# Patient Record
Sex: Male | Born: 1950 | Race: White | Hispanic: No | Marital: Married | Smoking: Former smoker
Health system: Southern US, Community
[De-identification: ages and names within clinical notes are randomized; demographics above are authoritative.]

## PROBLEM LIST (undated history)

## (undated) DIAGNOSIS — M1990 Unspecified osteoarthritis, unspecified site: Secondary | ICD-10-CM

---

## 2013-12-27 HISTORY — PX: COLONOSCOPY: SHX174

## 2019-09-13 ENCOUNTER — Other Ambulatory Visit: Payer: Self-pay | Admitting: Neurosurgery

## 2019-09-24 NOTE — Pre-Procedure Instructions (Signed)
Shawn Mendoza  09/24/2019    Your procedure is scheduled on Friday, September 28, 2019 at 1:25 PM.   Report to [facility] Entrance "A" Admitting Office at 11:25 AM.   Call this number if you have problems the morning of surgery: [phone]   Questions prior to day of surgery, please call [phone] between 8 & 4 PM.   Remember:  Do not eat or drink after midnight Thursday, 09/27/19.  Take these medicines the morning of surgery with A SIP OF WATER: Gabapentin (Neurontin)  Stop Multivitamins and Glucosamine as of today prior to surgery. Do not use Aspirin products (BC Powder, Goody's, etc), NSAIDS (Ibuprofen, Aleve, etc), Herbal medications or Fish Oil.    Do not wear jewelry.  Do not wear lotions, powders, cologne or deodorant.  Men may shave face and neck.  Do not bring valuables to the hospital.  [facility] is not responsible for any belongings or valuables.  Contacts, dentures or bridgework may not be worn into surgery.  Leave your suitcase in the car.  After surgery it may be brought to your room.  For patients admitted to the hospital, discharge time will be determined by your treatment team.  [facility] - Preparing for Surgery  Before surgery, you can play an important role.  Because skin is not sterile, your skin needs to be as free of germs as possible.  You can reduce the number of germs on you skin by washing with CHG (chlorahexidine gluconate) soap before surgery.  CHG is an antiseptic cleaner which kills germs and bonds with the skin to continue killing germs even after washing.  Oral Hygiene is also important in reducing the risk of infection.  Remember to brush your teeth with your regular toothpaste the morning of surgery.  Please DO NOT use if you have an allergy to CHG or antibacterial soaps.  If your skin becomes reddened/irritated stop using the CHG and inform your nurse when you arrive at Short Stay.  Do not shave (including legs and  underarms) for at least 48 hours prior to the first CHG shower.  You may shave your face.  Please follow these instructions carefully:   1.  Shower with CHG Soap the night before surgery and the morning of Surgery.  2.  If you choose to wash your hair, wash your hair first as usual with your normal shampoo.  3.  After you shampoo, rinse your hair and body thoroughly to remove the shampoo. 4.  Use CHG as you would any other liquid soap.  You can apply chg directly to the skin and wash gently with a      scrungie or washcloth.           5.  Apply the CHG Soap to your body ONLY FROM THE NECK DOWN.   Do not use on open wounds or open sores. Avoid contact with your eyes, ears, mouth and genitals (private parts).  Wash genitals (private parts) with your normal soap - do this prior to using CHG soap.  6.  Wash thoroughly, paying special attention to the area where your surgery will be performed.  7.  Thoroughly rinse your body with warm water from the neck down.  8.  DO NOT shower/wash with your normal soap after using and rinsing off the CHG Soap.  9.  Pat yourself dry with a clean towel.            10.  Wear clean pajamas.  11.  Place clean sheets on your bed the night of your first shower and do not sleep with pets.  Day of Surgery  Shower as above. Do not apply any lotions/deodorants the morning of surgery.   Please wear clean clothes to the hospital. Remember to brush your teeth with toothpaste.    Please read over the fact sheets that you were given.

## 2019-09-25 ENCOUNTER — Other Ambulatory Visit (HOSPITAL_COMMUNITY)
Admission: RE | Admit: 2019-09-25 | Discharge: 2019-09-25 | Disposition: A | Discharge status: Home / Self Care | Payer: Medicare HMO | Source: Ambulatory Visit | Attending: Neurosurgery | Admitting: Neurosurgery

## 2019-09-25 ENCOUNTER — Encounter (HOSPITAL_COMMUNITY): Payer: Self-pay

## 2019-09-25 ENCOUNTER — Encounter (HOSPITAL_COMMUNITY)
Admission: RE | Admit: 2019-09-25 | Discharge: 2019-09-25 | Disposition: A | Discharge status: Home / Self Care | Payer: Medicare HMO | Source: Ambulatory Visit | Attending: Neurosurgery | Admitting: Neurosurgery

## 2019-09-25 ENCOUNTER — Other Ambulatory Visit: Payer: Self-pay

## 2019-09-25 HISTORY — DX: Unspecified osteoarthritis, unspecified site: M19.90

## 2019-09-25 LAB — CBC WITH DIFFERENTIAL/PLATELET
Abs Immature Granulocytes: 0.02 10*3/uL (ref 0.00–0.07)
Basophils Absolute: 0 10*3/uL (ref 0.0–0.1)
Basophils Relative: 1 %
Eosinophils Absolute: 0 10*3/uL (ref 0.0–0.5)
Eosinophils Relative: 1 %
HCT: 47 % (ref 39.0–52.0)
Hemoglobin: 16.4 g/dL (ref 13.0–17.0)
Immature Granulocytes: 1 %
Lymphocytes Relative: 27 %
Lymphs Abs: 1.2 10*3/uL (ref 0.7–4.0)
MCH: 34.1 pg — ABNORMAL HIGH (ref 26.0–34.0)
MCHC: 34.9 g/dL (ref 30.0–36.0)
MCV: 97.7 fL (ref 80.0–100.0)
Monocytes Absolute: 0.5 10*3/uL (ref 0.1–1.0)
Monocytes Relative: 12 %
Neutro Abs: 2.6 10*3/uL (ref 1.7–7.7)
Neutrophils Relative %: 58 %
Platelets: 238 10*3/uL (ref 150–400)
RBC: 4.81 MIL/uL (ref 4.22–5.81)
RDW: 12.1 % (ref 11.5–15.5)
WBC: 4.4 10*3/uL (ref 4.0–10.5)
nRBC: 0 % (ref 0.0–0.2)

## 2019-09-25 LAB — TYPE AND SCREEN
ABO/RH(D): O NEG
Antibody Screen: NEGATIVE

## 2019-09-25 LAB — SURGICAL PCR SCREEN
MRSA, PCR: NEGATIVE
Staphylococcus aureus: NEGATIVE

## 2019-09-25 LAB — ABO/RH: ABO/RH(D): O NEG

## 2019-09-25 NOTE — Progress Notes (Signed)
PCP - Jens Som  Chest x-ray - n/a EKG - n/a  COVID TEST- 09-25-19   Anesthesia review: n/a  Patient denies shortness of breath, fever, cough and chest pain at PAT appointment   Patient verbalized understanding of instructions that were given to them at the PAT appointment. Patient was also instructed that they will need to review over the PAT instructions again at home before surgery.

## 2019-09-26 LAB — NOVEL CORONAVIRUS, NAA (HOSP ORDER, SEND-OUT TO REF LAB; TAT 18-24 HRS): SARS-CoV-2, NAA: NOT DETECTED

## 2019-09-28 ENCOUNTER — Inpatient Hospital Stay (HOSPITAL_COMMUNITY): Payer: Medicare HMO | Admitting: Certified Registered Nurse Anesthetist

## 2019-09-28 ENCOUNTER — Encounter (HOSPITAL_COMMUNITY)
Admission: RE | Disposition: A | Discharge status: Home / Self Care | Payer: Self-pay | Source: Home / Self Care | Attending: Neurosurgery

## 2019-09-28 ENCOUNTER — Inpatient Hospital Stay (HOSPITAL_COMMUNITY): Payer: Medicare HMO

## 2019-09-28 ENCOUNTER — Encounter (HOSPITAL_COMMUNITY): Payer: Self-pay

## 2019-09-28 ENCOUNTER — Inpatient Hospital Stay (HOSPITAL_COMMUNITY)
Admission: RE | Admit: 2019-09-28 | Discharge: 2019-09-29 | DRG: 455 | Disposition: A | Discharge status: Home / Self Care | Payer: Medicare HMO | Attending: Neurosurgery | Admitting: Neurosurgery

## 2019-09-28 ENCOUNTER — Other Ambulatory Visit: Payer: Self-pay

## 2019-09-28 DIAGNOSIS — Z20828 Contact with and (suspected) exposure to other viral communicable diseases: Secondary | ICD-10-CM | POA: Diagnosis present

## 2019-09-28 DIAGNOSIS — M48061 Spinal stenosis, lumbar region without neurogenic claudication: Secondary | ICD-10-CM | POA: Diagnosis present

## 2019-09-28 DIAGNOSIS — M4316 Spondylolisthesis, lumbar region: Principal | ICD-10-CM | POA: Diagnosis present

## 2019-09-28 DIAGNOSIS — M4310 Spondylolisthesis, site unspecified: Secondary | ICD-10-CM | POA: Diagnosis present

## 2019-09-28 DIAGNOSIS — Z87891 Personal history of nicotine dependence: Secondary | ICD-10-CM

## 2019-09-28 DIAGNOSIS — Z79899 Other long term (current) drug therapy: Secondary | ICD-10-CM

## 2019-09-28 DIAGNOSIS — M5416 Radiculopathy, lumbar region: Secondary | ICD-10-CM | POA: Diagnosis not present

## 2019-09-28 DIAGNOSIS — Z419 Encounter for procedure for purposes other than remedying health state, unspecified: Secondary | ICD-10-CM

## 2019-09-28 SURGERY — POSTERIOR LUMBAR FUSION 1 LEVEL
Anesthesia: General | Site: Back

## 2019-09-28 MED ORDER — ADULT MULTIVITAMIN W/MINERALS CH
1.0000 | ORAL_TABLET | Freq: Every day | ORAL | Status: DC
  Administered 2019-09-29: 08:00:00 1 via ORAL
  Filled 2019-09-28: qty 1

## 2019-09-28 MED ORDER — LIDOCAINE 2% (20 MG/ML) 5 ML SYRINGE
INTRAMUSCULAR | Status: DC | PRN
  Administered 2019-09-28: 40 mg via INTRAVENOUS

## 2019-09-28 MED ORDER — ONDANSETRON HCL 4 MG/2ML IJ SOLN
INTRAMUSCULAR | Status: DC | PRN
  Administered 2019-09-28: 4 mg via INTRAVENOUS

## 2019-09-28 MED ORDER — GABAPENTIN 300 MG PO CAPS
300.0000 mg | ORAL_CAPSULE | Freq: Three times a day (TID) | ORAL | Status: DC | PRN
  Administered 2019-09-29: 07:00:00 300 mg via ORAL
  Filled 2019-09-28: qty 1

## 2019-09-28 MED ORDER — VANCOMYCIN HCL 1000 MG IV SOLR
INTRAVENOUS | Status: AC
  Filled 2019-09-28: qty 1000

## 2019-09-28 MED ORDER — ROCURONIUM BROMIDE 10 MG/ML (PF) SYRINGE
PREFILLED_SYRINGE | INTRAVENOUS | Status: AC
  Filled 2019-09-28: qty 10

## 2019-09-28 MED ORDER — SODIUM CHLORIDE 0.9 % IV SOLN
INTRAVENOUS | Status: DC | PRN
  Administered 2019-09-28: 14:00:00 500 mL

## 2019-09-28 MED ORDER — THROMBIN 20000 UNITS EX SOLR
CUTANEOUS | Status: DC | PRN
  Administered 2019-09-28: 14:00:00 20 mL via TOPICAL

## 2019-09-28 MED ORDER — PHENOL 1.4 % MT LIQD: 1.0000 | OROMUCOSAL | Status: DC | PRN

## 2019-09-28 MED ORDER — ROCURONIUM BROMIDE 10 MG/ML (PF) SYRINGE
PREFILLED_SYRINGE | INTRAVENOUS | Status: DC | PRN
  Administered 2019-09-28 (×3): 10 mg via INTRAVENOUS
  Administered 2019-09-28: 20 mg via INTRAVENOUS
  Administered 2019-09-28: 10 mg via INTRAVENOUS
  Administered 2019-09-28: 50 mg via INTRAVENOUS
  Administered 2019-09-28: 20 mg via INTRAVENOUS

## 2019-09-28 MED ORDER — POLYETHYLENE GLYCOL 3350 17 G PO PACK: 17.0000 g | PACK | Freq: Every day | ORAL | Status: DC | PRN

## 2019-09-28 MED ORDER — HYDROMORPHONE HCL 1 MG/ML IJ SOLN
1.0000 mg | INTRAMUSCULAR | Status: DC | PRN
  Administered 2019-09-28: 17:00:00 1 mg via INTRAVENOUS
  Filled 2019-09-28: qty 1

## 2019-09-28 MED ORDER — CHLORHEXIDINE GLUCONATE CLOTH 2 % EX PADS: 6.0000 | MEDICATED_PAD | Freq: Once | CUTANEOUS | Status: DC

## 2019-09-28 MED ORDER — FLEET ENEMA 7-19 GM/118ML RE ENEM: 1.0000 | ENEMA | Freq: Once | RECTAL | Status: DC | PRN

## 2019-09-28 MED ORDER — THROMBIN 20000 UNITS EX SOLR
CUTANEOUS | Status: AC
  Filled 2019-09-28: qty 20000

## 2019-09-28 MED ORDER — SUFENTANIL CITRATE 50 MCG/ML IV SOLN
INTRAVENOUS | Status: DC | PRN
  Administered 2019-09-28 (×2): 15 ug via INTRAVENOUS
  Administered 2019-09-28: 5 ug via INTRAVENOUS

## 2019-09-28 MED ORDER — MIDAZOLAM HCL 2 MG/2ML IJ SOLN
INTRAMUSCULAR | Status: AC
  Filled 2019-09-28: qty 2

## 2019-09-28 MED ORDER — LATANOPROST 0.005 % OP SOLN
1.0000 [drp] | Freq: Every day | OPHTHALMIC | Status: DC
  Administered 2019-09-28: 20:00:00 1 [drp] via OPHTHALMIC
  Filled 2019-09-28: qty 2.5

## 2019-09-28 MED ORDER — VANCOMYCIN HCL 1 G IV SOLR
INTRAVENOUS | Status: DC | PRN
  Administered 2019-09-28: 1000 mg via TOPICAL

## 2019-09-28 MED ORDER — SODIUM CHLORIDE 0.9 % IV SOLN: 250.0000 mL | INTRAVENOUS | Status: DC

## 2019-09-28 MED ORDER — LIDOCAINE 2% (20 MG/ML) 5 ML SYRINGE
INTRAMUSCULAR | Status: AC
  Filled 2019-09-28: qty 5

## 2019-09-28 MED ORDER — ONDANSETRON HCL 4 MG/2ML IJ SOLN: 4.0000 mg | Freq: Once | INTRAMUSCULAR | Status: DC | PRN

## 2019-09-28 MED ORDER — OXYCODONE HCL 5 MG PO TABS: 5.0000 mg | ORAL_TABLET | Freq: Once | ORAL | Status: DC | PRN

## 2019-09-28 MED ORDER — LACTATED RINGERS IV SOLN
INTRAVENOUS | Status: DC
  Administered 2019-09-28 (×2): via INTRAVENOUS

## 2019-09-28 MED ORDER — DIAZEPAM 5 MG PO TABS
5.0000 mg | ORAL_TABLET | Freq: Four times a day (QID) | ORAL | Status: DC | PRN
  Administered 2019-09-28: 22:00:00 5 mg via ORAL
  Administered 2019-09-29 (×2): 10 mg via ORAL
  Filled 2019-09-28 (×2): qty 2
  Filled 2019-09-28: qty 1

## 2019-09-28 MED ORDER — HYDROCODONE-ACETAMINOPHEN 10-325 MG PO TABS: 1.0000 | ORAL_TABLET | ORAL | Status: DC | PRN

## 2019-09-28 MED ORDER — PROPOFOL 10 MG/ML IV BOLUS
INTRAVENOUS | Status: AC
  Filled 2019-09-28: qty 20

## 2019-09-28 MED ORDER — CEFAZOLIN SODIUM-DEXTROSE 1-4 GM/50ML-% IV SOLN
1.0000 g | Freq: Three times a day (TID) | INTRAVENOUS | Status: AC
  Administered 2019-09-28 – 2019-09-29 (×2): 1 g via INTRAVENOUS
  Filled 2019-09-28 (×2): qty 50

## 2019-09-28 MED ORDER — 0.9 % SODIUM CHLORIDE (POUR BTL) OPTIME
TOPICAL | Status: DC | PRN
  Administered 2019-09-28: 14:00:00 1000 mL

## 2019-09-28 MED ORDER — MENTHOL 3 MG MT LOZG: 1.0000 | LOZENGE | OROMUCOSAL | Status: DC | PRN

## 2019-09-28 MED ORDER — CEFAZOLIN SODIUM-DEXTROSE 2-4 GM/100ML-% IV SOLN
2.0000 g | INTRAVENOUS | Status: AC
  Administered 2019-09-28: 2 g via INTRAVENOUS

## 2019-09-28 MED ORDER — MIDAZOLAM HCL 5 MG/5ML IJ SOLN
INTRAMUSCULAR | Status: DC | PRN
  Administered 2019-09-28: 2 mg via INTRAVENOUS

## 2019-09-28 MED ORDER — BISACODYL 10 MG RE SUPP: 10.0000 mg | Freq: Every day | RECTAL | Status: DC | PRN

## 2019-09-28 MED ORDER — FENTANYL CITRATE (PF) 100 MCG/2ML IJ SOLN: 25.0000 ug | INTRAMUSCULAR | Status: DC | PRN

## 2019-09-28 MED ORDER — OXYCODONE HCL 5 MG/5ML PO SOLN: 5.0000 mg | Freq: Once | ORAL | Status: DC | PRN

## 2019-09-28 MED ORDER — PHENYLEPHRINE 40 MCG/ML (10ML) SYRINGE FOR IV PUSH (FOR BLOOD PRESSURE SUPPORT)
PREFILLED_SYRINGE | INTRAVENOUS | Status: DC | PRN
  Administered 2019-09-28 (×3): 80 ug via INTRAVENOUS

## 2019-09-28 MED ORDER — OXYCODONE HCL 5 MG PO TABS
10.0000 mg | ORAL_TABLET | ORAL | Status: DC | PRN
  Administered 2019-09-28 – 2019-09-29 (×5): 10 mg via ORAL
  Filled 2019-09-28 (×5): qty 2

## 2019-09-28 MED ORDER — SODIUM CHLORIDE 0.9 % IV SOLN
INTRAVENOUS | Status: DC | PRN
  Administered 2019-09-28: 25 ug/min via INTRAVENOUS

## 2019-09-28 MED ORDER — CEFAZOLIN SODIUM-DEXTROSE 2-4 GM/100ML-% IV SOLN
INTRAVENOUS | Status: AC
  Filled 2019-09-28: qty 100

## 2019-09-28 MED ORDER — ACETAMINOPHEN 650 MG RE SUPP: 650.0000 mg | RECTAL | Status: DC | PRN

## 2019-09-28 MED ORDER — ONDANSETRON HCL 4 MG/2ML IJ SOLN: 4.0000 mg | Freq: Four times a day (QID) | INTRAMUSCULAR | Status: DC | PRN

## 2019-09-28 MED ORDER — DEXAMETHASONE SODIUM PHOSPHATE 10 MG/ML IJ SOLN
INTRAMUSCULAR | Status: AC
  Filled 2019-09-28: qty 1

## 2019-09-28 MED ORDER — VITAMIN D3 25 MCG (1000 UNIT) PO TABS
2000.0000 [IU] | ORAL_TABLET | Freq: Every day | ORAL | Status: DC
  Administered 2019-09-29: 08:00:00 2000 [IU] via ORAL
  Filled 2019-09-28 (×2): qty 2

## 2019-09-28 MED ORDER — SODIUM CHLORIDE 0.9% FLUSH: 3.0000 mL | INTRAVENOUS | Status: DC | PRN

## 2019-09-28 MED ORDER — BUPIVACAINE HCL (PF) 0.25 % IJ SOLN
INTRAMUSCULAR | Status: DC | PRN
  Administered 2019-09-28: 20 mL

## 2019-09-28 MED ORDER — DEXAMETHASONE SODIUM PHOSPHATE 10 MG/ML IJ SOLN
10.0000 mg | Freq: Once | INTRAMUSCULAR | Status: AC
  Administered 2019-09-28: 13:00:00 10 mg via INTRAVENOUS

## 2019-09-28 MED ORDER — SUFENTANIL CITRATE 50 MCG/ML IV SOLN
INTRAVENOUS | Status: AC
  Filled 2019-09-28: qty 1

## 2019-09-28 MED ORDER — ONDANSETRON HCL 4 MG PO TABS: 4.0000 mg | ORAL_TABLET | Freq: Four times a day (QID) | ORAL | Status: DC | PRN

## 2019-09-28 MED ORDER — GLUCOSAMINE CHOND COMPLEX/MSM PO TABS: ORAL_TABLET | Freq: Every day | ORAL | Status: DC

## 2019-09-28 MED ORDER — BUPIVACAINE HCL (PF) 0.25 % IJ SOLN
INTRAMUSCULAR | Status: AC
  Filled 2019-09-28: qty 30

## 2019-09-28 MED ORDER — PROPOFOL 10 MG/ML IV BOLUS
INTRAVENOUS | Status: DC | PRN
  Administered 2019-09-28: 160 mg via INTRAVENOUS

## 2019-09-28 MED ORDER — ACETAMINOPHEN 325 MG PO TABS: 650.0000 mg | ORAL_TABLET | ORAL | Status: DC | PRN

## 2019-09-28 MED ORDER — SODIUM CHLORIDE 0.9% FLUSH
3.0000 mL | Freq: Two times a day (BID) | INTRAVENOUS | Status: DC
  Administered 2019-09-28: 22:00:00 3 mL via INTRAVENOUS

## 2019-09-28 SURGICAL SUPPLY — 58 items
BAG DECANTER FOR FLEXI CONT (MISCELLANEOUS) ×3 IMPLANT
BENZOIN TINCTURE PRP APPL 2/3 (GAUZE/BANDAGES/DRESSINGS) ×3 IMPLANT
BLADE CLIPPER SURG (BLADE) IMPLANT
BUR CUTTER 7.0 ROUND (BURR) IMPLANT
BUR MATCHSTICK NEURO 3.0 LAGG (BURR) ×3 IMPLANT
CANISTER SUCT 3000ML PPV (MISCELLANEOUS) ×3 IMPLANT
CAP LCK SPNE (Orthopedic Implant) ×4 IMPLANT
CAP LOCK SPINE RADIUS (Orthopedic Implant) IMPLANT
CAP LOCKING (Orthopedic Implant) ×8 IMPLANT
CARTRIDGE OIL MAESTRO DRILL (MISCELLANEOUS) ×1 IMPLANT
CLOSURE WOUND 1/2 X4 (GAUZE/BANDAGES/DRESSINGS) ×1
CONT SPEC 4OZ CLIKSEAL STRL BL (MISCELLANEOUS) ×3 IMPLANT
COVER BACK TABLE 60X90IN (DRAPES) ×3 IMPLANT
COVER WAND RF STERILE (DRAPES) ×3 IMPLANT
DECANTER SPIKE VIAL GLASS SM (MISCELLANEOUS) ×3 IMPLANT
DERMABOND ADVANCED (GAUZE/BANDAGES/DRESSINGS) ×2
DERMABOND ADVANCED .7 DNX12 (GAUZE/BANDAGES/DRESSINGS) ×1 IMPLANT
DEVICE INTERBODY ELEVATE 23X9 (Cage) ×4 IMPLANT
DIFFUSER DRILL AIR PNEUMATIC (MISCELLANEOUS) ×3 IMPLANT
DRAPE C-ARM 42X72 X-RAY (DRAPES) ×6 IMPLANT
DRAPE HALF SHEET 40X57 (DRAPES) IMPLANT
DRAPE LAPAROTOMY 100X72X124 (DRAPES) ×3 IMPLANT
DRAPE SURG 17X23 STRL (DRAPES) ×12 IMPLANT
DRSG OPSITE POSTOP 4X6 (GAUZE/BANDAGES/DRESSINGS) ×3 IMPLANT
DURAPREP 26ML APPLICATOR (WOUND CARE) ×3 IMPLANT
ELECT REM PT RETURN 9FT ADLT (ELECTROSURGICAL) ×3
ELECTRODE REM PT RTRN 9FT ADLT (ELECTROSURGICAL) ×1 IMPLANT
EVACUATOR 1/8 PVC DRAIN (DRAIN) IMPLANT
GAUZE 4X4 16PLY RFD (DISPOSABLE) IMPLANT
GAUZE SPONGE 4X4 12PLY STRL (GAUZE/BANDAGES/DRESSINGS) IMPLANT
GLOVE ECLIPSE 9.0 STRL (GLOVE) ×6 IMPLANT
GLOVE EXAM NITRILE XL STR (GLOVE) IMPLANT
GOWN STRL REUS W/ TWL LRG LVL3 (GOWN DISPOSABLE) IMPLANT
GOWN STRL REUS W/ TWL XL LVL3 (GOWN DISPOSABLE) ×2 IMPLANT
GOWN STRL REUS W/TWL 2XL LVL3 (GOWN DISPOSABLE) IMPLANT
GOWN STRL REUS W/TWL LRG LVL3 (GOWN DISPOSABLE)
GOWN STRL REUS W/TWL XL LVL3 (GOWN DISPOSABLE) ×4
KIT BASIN OR (CUSTOM PROCEDURE TRAY) ×3 IMPLANT
KIT TURNOVER KIT B (KITS) ×3 IMPLANT
MILL MEDIUM DISP (BLADE) ×3 IMPLANT
NEEDLE HYPO 22GX1.5 SAFETY (NEEDLE) ×3 IMPLANT
NS IRRIG 1000ML POUR BTL (IV SOLUTION) ×3 IMPLANT
OIL CARTRIDGE MAESTRO DRILL (MISCELLANEOUS) ×3
PACK LAMINECTOMY NEURO (CUSTOM PROCEDURE TRAY) ×3 IMPLANT
ROD RADIUS 40MM (Neuro Prosthesis/Implant) ×4 IMPLANT
ROD SPNL 40X5.5XNS TI RDS (Neuro Prosthesis/Implant) IMPLANT
SCREW 5.75X40M (Screw) ×4 IMPLANT
SCREW 5.75X45MM (Screw) ×4 IMPLANT
SPONGE SURGIFOAM ABS GEL 100 (HEMOSTASIS) ×3 IMPLANT
STRIP CLOSURE SKIN 1/2X4 (GAUZE/BANDAGES/DRESSINGS) ×3 IMPLANT
SUT VIC AB 0 CT1 18XCR BRD8 (SUTURE) ×2 IMPLANT
SUT VIC AB 0 CT1 8-18 (SUTURE) ×4
SUT VIC AB 2-0 CT1 18 (SUTURE) ×3 IMPLANT
SUT VIC AB 3-0 SH 8-18 (SUTURE) ×6 IMPLANT
TOWEL GREEN STERILE (TOWEL DISPOSABLE) ×3 IMPLANT
TOWEL GREEN STERILE FF (TOWEL DISPOSABLE) ×3 IMPLANT
TRAY FOLEY MTR SLVR 16FR STAT (SET/KITS/TRAYS/PACK) ×3 IMPLANT
WATER STERILE IRR 1000ML POUR (IV SOLUTION) ×3 IMPLANT

## 2019-09-28 NOTE — Anesthesia Postprocedure Evaluation (Signed)
Anesthesia Post Note  Patient: Shawn Mendoza  Procedure(s) Performed: Posterior Lumbar Interbody Fusion - Lumbar Four -Lumbar Five (N/A Back)     Patient location during evaluation: PACU Anesthesia Type: General Level of consciousness: awake and alert Pain management: pain level controlled Vital Signs Assessment: post-procedure vital signs reviewed and stable Respiratory status: spontaneous breathing, nonlabored ventilation, respiratory function stable and patient connected to nasal cannula oxygen Cardiovascular status: blood pressure returned to baseline and stable Postop Assessment: no apparent nausea or vomiting Anesthetic complications: no    Last Vitals:  Vitals:   09/28/19 1600 09/28/19 1627  BP: (!) 155/89 (!) 159/92  Pulse: 82 79  Resp: (!) 24 18  Temp:  36.7 C  SpO2: 97% 99%    Last Pain:  Vitals:   09/28/19 1627  TempSrc: Oral  PainSc:                  Kiah Keay COKER

## 2019-09-28 NOTE — Transfer of Care (Signed)
Immediate Anesthesia Transfer of Care Note  Patient: Shawn Mendoza  Procedure(s) Performed: Posterior Lumbar Interbody Fusion - Lumbar Four -Lumbar Five (N/A Back)  Patient Location: PACU  Anesthesia Type:General  Level of Consciousness: awake and patient cooperative  Airway & Oxygen Therapy: Patient Spontanous Breathing and Patient connected to nasal cannula oxygen  Post-op Assessment: Report given to RN, Post -op Vital signs reviewed and stable and Patient moving all extremities  Post vital signs: Reviewed and stable  Last Vitals:  Vitals Value Taken Time  BP    Temp    Pulse 86 09/28/19 1529  Resp 18 09/28/19 1529  SpO2 100 % 09/28/19 1529  Vitals shown include unvalidated device data.  Last Pain:  Vitals:   09/28/19 1527  TempSrc:   PainSc: (P) 0-No pain      Patients Stated Pain Goal: 3 (13/24/40 1027)  Complications: No apparent anesthesia complications

## 2019-09-28 NOTE — H&P (Signed)
  Shawn Mendoza is an 68 y.o. male.   Chief Complaint: Back pain HPI: 68 year old male with severe back and bilateral lower extremity symptoms left greater than right.  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  Patient is failed conservative management and presents now for decompression and fusion surgery in hopes of improving his symptoms.  Past Medical History:  Diagnosis Date  . Arthritis     Past Surgical History:  Procedure Laterality Date  . COLONOSCOPY  2015    History reviewed. No pertinent family history. Social History:  reports that he has quit smoking. He has never used smokeless tobacco. He reports current alcohol use of about 14.0 standard drinks of alcohol per week. He reports that he does not use drugs.  Allergies: No Known Allergies  Medications Prior to Admission  Medication Sig Dispense Refill  . Cholecalciferol (VITAMIN D) 50 MCG (2000 UT) tablet Take 2,000 Units by mouth daily.    Marland Kitchen gabapentin (NEURONTIN) 300 MG capsule Take 300 mg by mouth 3 (three) times daily as needed (pain).     Marland Kitchen latanoprost (XALATAN) 0.005 % ophthalmic solution Place 1 drop into both eyes at bedtime.    . Misc Natural Products (GLUCOSAMINE CHOND COMPLEX/MSM PO) Take 1 tablet by mouth daily.    . Multiple Vitamins-Minerals (MULTIVITAMIN WITH MINERALS) tablet Take 1 tablet by mouth daily.    Marland Kitchen OVER THE COUNTER MEDICATION Take 1 capsule by mouth at bedtime. [name]      No results found for this or any previous visit (from the past 48 hour(s)). No results found.  Pertinent items noted in HPI and remainder of comprehensive ROS otherwise negative.  Blood pressure (!) 164/91, pulse 71, temperature 97.9 F (36.6 C), temperature source Oral, resp. rate 18, height 5\' 9"  (1.753 m), weight 79.7 kg, SpO2 100 %.  Patient is awake and alert.  He is oriented  and appropriate.  Speech is fluent.  Judgment insight are intact.  Cranial nerve function normal bilateral.  Motor examination with mild weakness of dorsiflexion of his left foot otherwise motor strength intact.  Sensory examination with decrease sensation pinprick light touch in his left L5 dermatome.  Deep tendon reflexes normal active.  No evidence of long track signs.  Gait antalgic.  Posture flexed.  Examination head ears eyes nose throat is unremarked.  Chest and abdomen are benign.  Extremities are free from injury or deformity. Assessment/Plan L4-5 unstable degenerative spondylolisthesis with stenosis and resultant radiculopathy.  Plan bilateral L4-5 decompressive laminotomies and foraminotomies followed by posterior lumbar interbody fusion utilizing interbody cages, local harvested autograft, and augmented with posterior lateral arthrodesis utilizing nonsegmental pedicle screw fixation and local autografting.  Risks and benefits of been explained.  Patient wishes to proceed.  Cooper Render Dianelly Ferran 09/28/2019, 12:28 PM

## 2019-09-28 NOTE — Brief Op Note (Signed)
09/28/2019  3:20 PM  PATIENT:  Shawn Mendoza  68 y.o. male  PRE-OPERATIVE DIAGNOSIS:  Spondylolisthesis  POST-OPERATIVE DIAGNOSIS:  Spondylolisthesis  PROCEDURE:  Procedure(s) with comments: Posterior Lumbar Interbody Fusion - Lumbar Four -Lumbar Five (N/A) - Posterior Lumbar Interbody Fusion - Lumbar Four -Lumbar Five  SURGEON:  Surgeon(s) and Role:    Earnie Larsson, MD - Primary  PHYSICIAN ASSISTANT:   ASSISTANTSMearl Latin   ANESTHESIA:   general  EBL:  100 mL   BLOOD ADMINISTERED:none  DRAINS: none   LOCAL MEDICATIONS USED:  MARCAINE     SPECIMEN:  No Specimen  DISPOSITION OF SPECIMEN:  N/A  COUNTS:  YES  TOURNIQUET:  * No tourniquets in log *  DICTATION: .Dragon Dictation  PLAN OF CARE: Admit to inpatient   PATIENT DISPOSITION:  PACU - hemodynamically stable.   Delay start of Pharmacological VTE agent (>24hrs) due to surgical blood loss or risk of bleeding: yes

## 2019-09-28 NOTE — Anesthesia Procedure Notes (Signed)
Procedure Name: Intubation Date/Time: 09/28/2019 1:10 PM Performed by: Moshe Salisbury, CRNA Pre-anesthesia Checklist: Patient identified, Emergency Drugs available, Suction available and Patient being monitored Patient Re-evaluated:Patient Re-evaluated prior to induction Oxygen Delivery Method: Circle System Utilized Preoxygenation: Pre-oxygenation with 100% oxygen Induction Type: IV induction Ventilation: Mask ventilation without difficulty Laryngoscope Size: Mac and 4 Grade View: Grade I Tube type: Oral Tube size: 8.0 mm Number of attempts: 1 Airway Equipment and Method: Stylet Placement Confirmation: ETT inserted through vocal cords under direct vision,  positive ETCO2 and breath sounds checked- equal and bilateral Secured at: 22 cm Tube secured with: Tape Dental Injury: Teeth and Oropharynx as per pre-operative assessment

## 2019-09-28 NOTE — Evaluation (Signed)
Physical Therapy Evaluation Patient Details Name: Shawn Mendoza MRN: [id number] DOB: Dec 22, 1951 Today's Date: 09/28/2019   History of Present Illness  Pt is a 68 y/o male s/p L4-5 PLIF. PMH includes arthritis.   Clinical Impression  Patient is s/p above surgery resulting in the deficits listed below (see PT Problem List). Pt requiring min to min guard A for mobility tasks this session. Pt presenting with mild unsteadiness, however, no overt LOB noted. Educated about generalized walking program and back precautions. Patient will benefit from skilled PT to increase their independence and safety with mobility (while adhering to their precautions) to allow discharge to the venue listed below.     Follow Up Recommendations No PT follow up    Equipment Recommendations  3in1 (PT)    Recommendations for Other Services       Precautions / Restrictions Precautions Precautions: Back Precaution Booklet Issued: Yes (comment) Precaution Comments: Reviewed back precautions with pt.  Required Braces or Orthoses: Spinal Brace Spinal Brace: Lumbar corset;Applied in sitting position Restrictions Weight Bearing Restrictions: No      Mobility  Bed Mobility Overal bed mobility: Needs Assistance Bed Mobility: Rolling;Sidelying to Sit;Sit to Sidelying Rolling: Supervision Sidelying to sit: Supervision     Sit to sidelying: Supervision General bed mobility comments: Supervision to ensure log roll technique.   Transfers Overall transfer level: Needs assistance Equipment used: None Transfers: Sit to/from Stand Sit to Stand: Min assist         General transfer comment: Min A for steadying assist. Cues for posture during gait.   Ambulation/Gait Ambulation/Gait assistance: Min guard Gait Distance (Feet): 150 Feet Assistive device: IV Pole Gait Pattern/deviations: Step-through pattern;Decreased stride length Gait velocity: Decreasd   General Gait Details: Mild unsteadiness  noted. Used IV pole for stability. distance limited secondary to pain. Educated about generalized walking program to perform at home.   Stairs            Wheelchair Mobility    Modified Rankin (Stroke Patients Only)       Balance Overall balance assessment: Needs assistance Sitting-balance support: No upper extremity supported;Feet supported Sitting balance-Leahy Scale: Good     Standing balance support: Single extremity supported;No upper extremity supported;During functional activity Standing balance-Leahy Scale: Fair Standing balance comment: Able to maintain static standing without UE support                              Pertinent Vitals/Pain Pain Assessment: 0-10 Pain Score: 7  Pain Location: back Pain Descriptors / Indicators: Aching;Operative site guarding Pain Intervention(s): Limited activity within patient's tolerance;Monitored during session;Patient requesting pain meds-RN notified;RN gave pain meds during session    Home Living Family/patient expects to be discharged to:: Private residence Living Arrangements: Spouse/significant other Available Help at Discharge: Family;Available 24 hours/day Type of Home: House Home Access: Stairs to enter Entrance Stairs-Rails: None Entrance Stairs-Number of Steps: 1 Home Layout: Two level Home Equipment: None      Prior Function Level of Independence: Independent               Hand Dominance        Extremity/Trunk Assessment   Upper Extremity Assessment Upper Extremity Assessment: Defer to OT evaluation    Lower Extremity Assessment Lower Extremity Assessment: Overall WFL for tasks assessed    Cervical / Trunk Assessment Cervical / Trunk Assessment: Other exceptions Cervical / Trunk Exceptions: s/p lumbar surgery   Communication   Communication: No  difficulties  Cognition Arousal/Alertness: Awake/alert Behavior During Therapy: WFL for tasks assessed/performed Overall Cognitive  Status: Within Functional Limits for tasks assessed                                        General Comments      Exercises     Assessment/Plan    PT Assessment Patient needs continued PT services  PT Problem List Decreased strength;Decreased balance;Decreased mobility;Decreased activity tolerance;Decreased knowledge of precautions;Pain       PT Treatment Interventions Gait training;Stair training;Therapeutic activities;Functional mobility training;Patient/family education    PT Goals (Current goals can be found in the Care Plan section)  Acute Rehab PT Goals Patient Stated Goal: to go home PT Goal Formulation: With patient Time For Goal Achievement: 10/12/19 Potential to Achieve Goals: Good    Frequency Min 5X/week   Barriers to discharge        Co-evaluation               AM-PAC PT "6 Clicks" Mobility  Outcome Measure Help needed turning from your back to your side while in a flat bed without using bedrails?: None Help needed moving from lying on your back to sitting on the side of a flat bed without using bedrails?: None Help needed moving to and from a bed to a chair (including a wheelchair)?: A Little Help needed standing up from a chair using your arms (e.g., wheelchair or bedside chair)?: A Little Help needed to walk in hospital room?: A Little Help needed climbing 3-5 steps with a railing? : A Little 6 Click Score: 20    End of Session Equipment Utilized During Treatment: Back brace Activity Tolerance: Patient limited by pain Patient left: in bed;with call bell/phone within reach;with nursing/sitter in room Nurse Communication: Mobility status;Patient requests pain meds PT Visit Diagnosis: Other abnormalities of gait and mobility (R26.89);Pain Pain - part of body: (back)    Time: 8101-7510 PT Time Calculation (min) (ACUTE ONLY): 23 min   Charges:   PT Evaluation $PT Eval Low Complexity: 1 Low PT Treatments $Gait Training: 8-22  mins        Gladys Damme, PT, DPT  Acute Rehabilitation Services  Pager: [phone] Office: [phone]   Lehman Prom 09/28/2019, 6:07 PM

## 2019-09-28 NOTE — Op Note (Signed)
Date of procedure: 09/28/2019  Date of dictation: Same  Service: Neurosurgery  Preoperative diagnosis: L4-L5 unstable degenerative spondylolisthesis with back pain and radiculopathy  Postoperative diagnosis: Same  Procedure Name: Bilateral L4-5 decompressive laminotomies with bilateral L4 and L5 decompressive foraminotomies, more than would be required for simple interbody fusion alone.  L4-5 Ponte osteotomies for sagittal plane balance restoration  L4-5 posterior lumbar interbody fusion utilizing interbody cages and locally harvested autograft  L4-5 posterior lateral arthrodesis utilizing nonsegmental pedicle screw fixation and local autografting  Surgeon:Jakaleb Payer A.Jasun Gasparini, M.D.  Asst. Surgeon: Reinaldo Meeker, NP  Anesthesia: General  Indication: 68 year old male with severe back and bilateral lower extremity pain left greater than right.  Work-up demonstrates evidence of an unstable L4-5 degenerative spondylolisthesis with a leftward L4-5 foraminal disc herniation.  Patient is failed all efforts of conservative management.  He presents now for lumbar decompression and fusion L4-5 in hopes of improving his symptoms.  Operative note: After induction of anesthesia, patient position prone on the Wilson frame and appropriately padded.  Lumbar region prepped and draped sterilely.  Incision made overlying L4-5.  Dissection performed bilaterally.  Retractor placed.  Fluoroscopy used.  Level confirmed.  Decompressive laminotomies and facetectomies were then performed using Leksell rongeurs and Kerrison rongeurs to remove the inferior aspect of the lamina of L4 the entire inferior facet and pars interarticularis of L4 bilaterally the majority the superior facet of L5 bilaterally and the superior aspect the L5 lamina.  Ligament flavum elevated and resected.  Foraminotomies completed on the course the exiting L4 and L5 nerve roots bilaterally.  Facetectomies further extended to complete ponte osteotomies  bilaterally to enable sagittal plane correction.  Bilateral discectomies then performed at L4-5.  The spaces then prepared for interbody fusion.  With the dissipates distractor in patient's right side to space cleaned of all soft tissues on the left side.  A 9 mm [employer] expandable cage was then impacted into place and expanded to its full extent.  This cage was packed with locally harvested autograft.  Distractor was moved patient's right side.  Dissipates then prepared for interbody fusion on the right side.  Morselized autograft was then packed into the interspace.  A second 9 mm cage packed with autograft was then impacted into place and expanded to its full extent.  Pedicles of L4 and L5 were identified using surface landmarks and intraoperative fluoroscopy.  Superficial bone around the pedicle was then removed using high-speed drill.  Pedicle was then probed using a pedicle all each pedicle tract was then probed and found to be solidly within the bone.  Each pedicle tract was then tapped with a screw tapped.  Screw temple was then probed and found to be solidly within the bone.  5.75 mm radius brand screws from [employer] were placed bilaterally at L4 and L5.  Final images reveal good position the cage and the hardware at the proper operative level with marked improvement of his spinal alignment.  Transverse processes of L4 and L5 were decorticated.  Morselized autograft was packed posterior laterally.  Short segment titanium rod placed over the screws at L4 and L5.  Locking caps placed over the screws.  Locking caps then engaged with a construct under mild compression.  Gelfoam placed over the laminotomy defects.  Vancomycin powder was left in the deep wound space.  Wounds and closed in layers with Vicryl sutures.  Steri-Strips and sterile dressing were applied.  No apparent complications.  Patient tolerated the procedure well and he returns to the  recovery room postop.

## 2019-09-28 NOTE — Anesthesia Preprocedure Evaluation (Signed)

## 2019-09-28 NOTE — Progress Notes (Signed)
Orthopedic Tech Progress Note Patient Details:  Shawn Mendoza 02/25/1951 [id number] Patient has brace. Dropped off in PACU this morning Patient ID: Carley Strickling, male   DOB: May 11, 1951, 68 y.o.   MRN: [id number]   Janit Pagan 09/28/2019, 4:42 PM

## 2019-09-29 NOTE — Discharge Instructions (Signed)

## 2019-09-29 NOTE — Evaluation (Signed)
Occupational Therapy Evaluation Patient Details Name: Shawn Mendoza MRN: [id number] DOB: [birth year] Today's Date: 09/29/2019    History of Present Illness Pt is a 68 y/o male s/p L4-5 PLIF. PMH includes arthritis.    Clinical Impression   Patient evaluated by Occupational Therapy with no further acute OT needs identified. All education has been completed and the patient has no further questions. See below for any follow-up Occupational Therapy or equipment needs. OT to sign off. Thank you for referral.      Follow Up Recommendations  No OT follow up    Equipment Recommendations  None recommended by OT    Recommendations for Other Services       Precautions / Restrictions Precautions Precautions: Back Precaution Comments: reviewed back precautions for adls Required Braces or Orthoses: Spinal Brace Spinal Brace: Lumbar corset      Mobility Bed Mobility Overal bed mobility: Modified Independent             General bed mobility comments: in chair on arrival  Transfers Overall transfer level: Needs assistance   Transfers: Sit to/from Stand Sit to Stand: Supervision         General transfer comment: cues for posture and prevent arching    Balance Overall balance assessment: No apparent balance deficits (not formally assessed)                                         ADL either performed or assessed with clinical judgement   ADL Overall ADL's : Modified independent                                       General ADL Comments: pt is able to figure 4 cross to don socks and shoes. pt declines full body dressing as wife is bring a different set of clothes. pt educated on back precuations and bathing.    Back handout provided and reviewed adls in detail. Pt educated on: clothing between brace, never sleep in brace, set an alarm at night for medication, avoid sitting for long periods of time, correct bed positioning for  sleeping, correct sequence for bed mobility, avoiding lifting more than 5 pounds and never wash directly over incision. Educated and answering questions about seating in the home. Pt plans to use a reacher at home for dressing and bathing as needed. All education is complete and patient indicates understanding.    Vision Baseline Vision/History: Wears glasses Wears Glasses: Reading only       Perception     Praxis      Pertinent Vitals/Pain Pain Assessment: Faces Pain Score: 7  Faces Pain Scale: Hurts little more Pain Location: back Pain Descriptors / Indicators: Operative site guarding Pain Intervention(s): Monitored during session;Premedicated before session;Repositioned     Hand Dominance Right   Extremity/Trunk Assessment Upper Extremity Assessment Upper Extremity Assessment: Overall WFL for tasks assessed   Lower Extremity Assessment Lower Extremity Assessment: Defer to PT evaluation   Cervical / Trunk Assessment Cervical / Trunk Assessment: Other exceptions Cervical / Trunk Exceptions: s/p lumbar surgery    Communication Communication Communication: No difficulties   Cognition Arousal/Alertness: Awake/alert Behavior During Therapy: WFL for tasks assessed/performed Overall Cognitive Status: Within Functional Limits for tasks assessed  General Comments  removing stickers for heart monitor during session    Exercises     Shoulder Instructions      Home Living Family/patient expects to be discharged to:: Private residence Living Arrangements: Spouse/significant other Available Help at Discharge: Family;Available 24 hours/day Type of Home: House Home Access: Stairs to enter [employer] of Steps: 1 Entrance Stairs-Rails: None Home Layout: Two level Alternate Level Stairs-Number of Steps: flight Alternate Level Stairs-Rails: Right;Left Bathroom Shower/Tub: Producer, television/film/video:  Standard     Home Equipment: None   Additional Comments: wife can (A) and son flying into town to him       Prior Functioning/Environment Level of Independence: Independent                 OT Problem List: Decreased strength;Impaired balance (sitting and/or standing)      OT Treatment/Interventions:      OT Goals(Current goals can be found in the care plan section) Acute Rehab OT Goals Patient Stated Goal: to go home Potential to Achieve Goals: Good  OT Frequency:     Barriers to D/C:            Co-evaluation              AM-PAC OT "6 Clicks" Daily Activity     Outcome Measure Help from another person eating meals?: None Help from another person taking care of personal grooming?: None Help from another person toileting, which includes using toliet, bedpan, or urinal?: None Help from another person bathing (including washing, rinsing, drying)?: None Help from another person to put on and taking off regular upper body clothing?: None Help from another person to put on and taking off regular lower body clothing?: None 6 Click Score: 24   End of Session Equipment Utilized During Treatment: Back brace Nurse Communication: Mobility status;Precautions  Activity Tolerance: Patient tolerated treatment well Patient left: in chair;with call bell/phone within reach  OT Visit Diagnosis: Unsteadiness on feet (R26.81)                Time: 3335-4562 OT Time Calculation (min): 24 min Charges:  OT General Charges $OT Visit: 1 Visit OT Evaluation $OT Eval Moderate Complexity: 1 Mod OT Treatments $Self Care/Home Management : 8-22 mins   Mateo Flow, OTR/L  Acute Rehabilitation Services Pager: [phone] Office: [phone] .   Mateo Flow 09/29/2019, 9:28 AM

## 2019-09-29 NOTE — Progress Notes (Signed)
Physical Therapy Treatment Patient Details Name: Shawn Mendoza MRN: [id number] DOB: [birth year] Today's Date: 09/29/2019    History of Present Illness Pt is a 68 y/o male s/p L4-5 PLIF. PMH includes arthritis.     PT Comments    Pt reports not sleeping well and having increased pain over night. Pt with improved mobility with increased gait distance, stairs and functional mobility. Pt with education for all precautions, transfers and functional mobility with ability to don/doff brace without assist. Return home remains appropriate with walking program and activity progression discussed.     Follow Up Recommendations  No PT follow up     Equipment Recommendations  3in1 (PT)    Recommendations for Other Services       Precautions / Restrictions Precautions Precautions: Back Precaution Comments: Reviewed back precautions with pt.  Required Braces or Orthoses: Spinal Brace Spinal Brace: Lumbar corset;Applied in sitting position    Mobility  Bed Mobility Overal bed mobility: Modified Independent             General bed mobility comments: pt able to transition supine<>sit without cues with bed flat and good technique  Transfers Overall transfer level: Needs assistance   Transfers: Sit to/from Stand Sit to Stand: Supervision         General transfer comment: cues for posture and prevent arching  Ambulation/Gait Ambulation/Gait assistance: Modified independent (Device/Increase time) Gait Distance (Feet): 400 Feet Assistive device: None Gait Pattern/deviations: Step-through pattern;Decreased stride length   Gait velocity interpretation: >2.62 ft/sec, indicative of community ambulatory General Gait Details: pt with decreased speed with steady gait cues for posture   Stairs Stairs: Yes Stairs assistance: Modified independent (Device/Increase time) Stair Management: Step to pattern;Forwards Number of Stairs: 1     Wheelchair Mobility    Modified  Rankin (Stroke Patients Only)       Balance Overall balance assessment: No apparent balance deficits (not formally assessed)                                          Cognition Arousal/Alertness: Awake/alert Behavior During Therapy: WFL for tasks assessed/performed Overall Cognitive Status: Within Functional Limits for tasks assessed                                        Exercises      General Comments        Pertinent Vitals/Pain Pain Score: 7  Pain Location: back Pain Descriptors / Indicators: Aching;Operative site guarding Pain Intervention(s): Limited activity within patient's tolerance;Premedicated before session;Repositioned    Home Living                      Prior Function            PT Goals (current goals can now be found in the care plan section) Progress towards PT goals: Progressing toward goals    Frequency    Min 5X/week      PT Plan Current plan remains appropriate    Co-evaluation              AM-PAC PT "6 Clicks" Mobility   Outcome Measure  Help needed turning from your back to your side while in a flat bed without using bedrails?: None Help needed moving from lying on your back  to sitting on the side of a flat bed without using bedrails?: None Help needed moving to and from a bed to a chair (including a wheelchair)?: A Little Help needed standing up from a chair using your arms (e.g., wheelchair or bedside chair)?: A Little Help needed to walk in hospital room?: A Little Help needed climbing 3-5 steps with a railing? : None 6 Click Score: 21    End of Session Equipment Utilized During Treatment: Back brace Activity Tolerance: Patient tolerated treatment well Patient left: Other (comment);with call bell/phone within reach(on toilet with RN aware) Nurse Communication: Mobility status;Precautions PT Visit Diagnosis: Other abnormalities of gait and mobility (R26.89)     Time:  9449-6759 PT Time Calculation (min) (ACUTE ONLY): 26 min  Charges:  $Gait Training: 8-22 mins $Therapeutic Activity: 8-22 mins                     Rami Budhu Abner Greenspan, PT Acute Rehabilitation Services Pager: [phone] Office: [phone]    Armanie Martine B Zymiere Trostle 09/29/2019, 8:18 AM

## 2019-09-29 NOTE — Discharge Summary (Signed)
Physician Discharge Summary  Patient ID: Shawn Mendoza MRN: [id number] DOB/AGE: 1951-12-04 68 y.o.  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 Post surgery he is ambulating, voiding, and tolerating a regular diet. His wound is clean,dry, and without signs of infection.   Treatments: surgery: Bilateral L4-5 decompressive laminotomies with bilateral L4 and L5 decompressive foraminotomies, more than would be required for simple interbody fusion alone.   L4-5 Ponte osteotomies for sagittal plane balance restoration   L4-5 posterior lumbar interbody fusion utilizing interbody cages and locally harvested autograft   L4-5 posterior lateral arthrodesis utilizing nonsegmental pedicle screw fixation and local autografting   Discharge Exam: Blood pressure 117/74, pulse 75, temperature 98.9 F (37.2 C), temperature source Oral, resp. rate 20, height 5\' 9"  (1.753 m), weight 79.7 kg, SpO2 98 %. General appearance: alert, cooperative, appears stated age and mild distress Neurologic: Alert and oriented X 3, normal strength and tone. Normal symmetric reflexes. Normal coordination and gait  Disposition: Discharge disposition: 01-Home or Self Care      Spondylolisthesis  Allergies as of 09/29/2019   No Known Allergies     Medication List    TAKE these medications   gabapentin 300 MG capsule Commonly known as: NEURONTIN Take 300 mg by mouth 3 (three) times daily as needed (pain).   GLUCOSAMINE CHOND COMPLEX/MSM PO Take 1 tablet by mouth daily.    latanoprost 0.005 % ophthalmic solution Commonly known as: XALATAN Place 1 drop into both eyes at bedtime.   multivitamin with minerals tablet Take 1 tablet by mouth daily.   OVER THE COUNTER MEDICATION Take 1 capsule by mouth at bedtime. [name]   Vitamin D 50 MCG (2000 UT) tablet Take 2,000 Units by mouth daily.            Durable Medical Equipment  (From admission, onward)         Start     Ordered   09/29/19 0742  For home use only DME 3 n 1  Once     09/29/19 0741   09/28/19 1638  DME Walker rolling  Once    Question:  Patient needs a walker to treat with the following condition  Answer:  Degenerative spondylolisthesis   09/28/19 1637   09/28/19 1638  DME 3 n 1  Once     09/28/19 1637           Signed: Dashon Mcintire L 09/29/2019, 7:47 AM

## 2019-09-29 NOTE — Plan of Care (Signed)
Patient alert and oriented, Mae's well, voiding adequate amount of urine, swallowing without difficulty, c/o moderate pain and meds given prior to discharged for ride and discomfort. Patient discharged home with family. Script sent to the pharmacy and discharged instructions given to patient and spouse. Patient and family stated understanding of instructions given. Patient has an appointment with Dr. Annette Stable in two weeks

## 2019-10-03 MED FILL — Heparin Sodium (Porcine) Inj 1000 Unit/ML: INTRAMUSCULAR | Qty: 30 | Status: AC

## 2019-10-03 MED FILL — Sodium Chloride IV Soln 0.9%: INTRAVENOUS | Qty: 1000 | Status: AC

## 2021-05-27 IMAGING — RF DG LUMBAR SPINE 2-3V
1 series · 2 of 2 positions shown · non-contrast
Comparison: Lumbar spine radiographs 09/13/2019.

CLINICAL DATA: L4-5 PLIF.

EXAM:
LUMBAR SPINE - 2-3 VIEW; DG C-ARM 1-60 MIN

[Series 1: run · 2 of 2 slices shown]
[im 1/2]
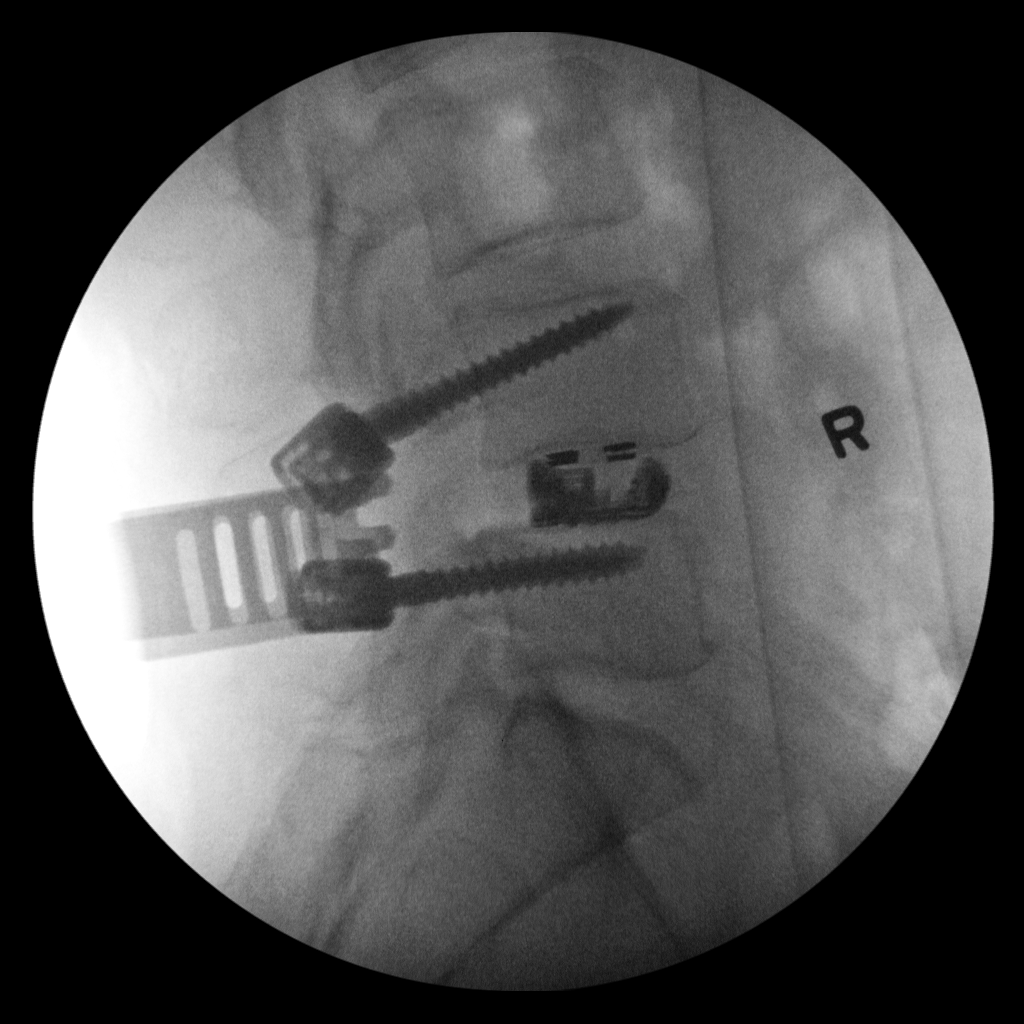
[im 2/2]
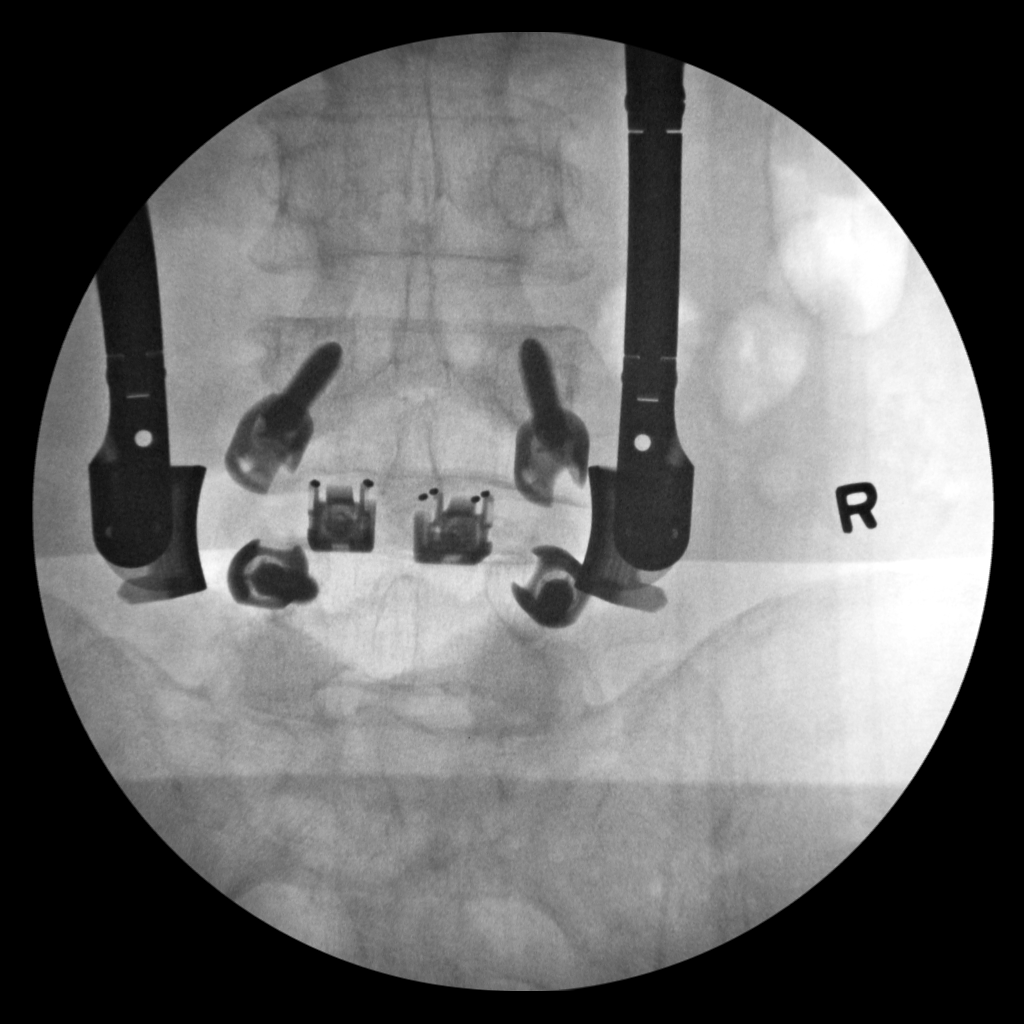

[2 of 2 positions shown; findings below may reference images not displayed]

FINDINGS: Fluoroscopic guidance was provided for the procedure. 24 seconds of
fluoroscopy. Two spot fluoroscopic images are submitted.

Five lumbar type vertebral bodies are assumed. The submitted images
demonstrate the placement of bilateral pedicle screws at the L4 and
L5 levels. The interconnecting rods have not yet been placed.
Interbody spacers are well positioned.
IMPRESSION: Intraoperative views during L4-5 PLIF. No demonstrated
complications.

## 2021-05-27 IMAGING — RF DG C-ARM 1-60 MIN
1 series · 2 of 2 positions shown · non-contrast
Comparison: Lumbar spine radiographs 09/13/2019.

CLINICAL DATA: L4-5 PLIF.

EXAM:
LUMBAR SPINE - 2-3 VIEW; DG C-ARM 1-60 MIN

[Series 1: run · 2 of 2 slices shown]
[im 1/2]
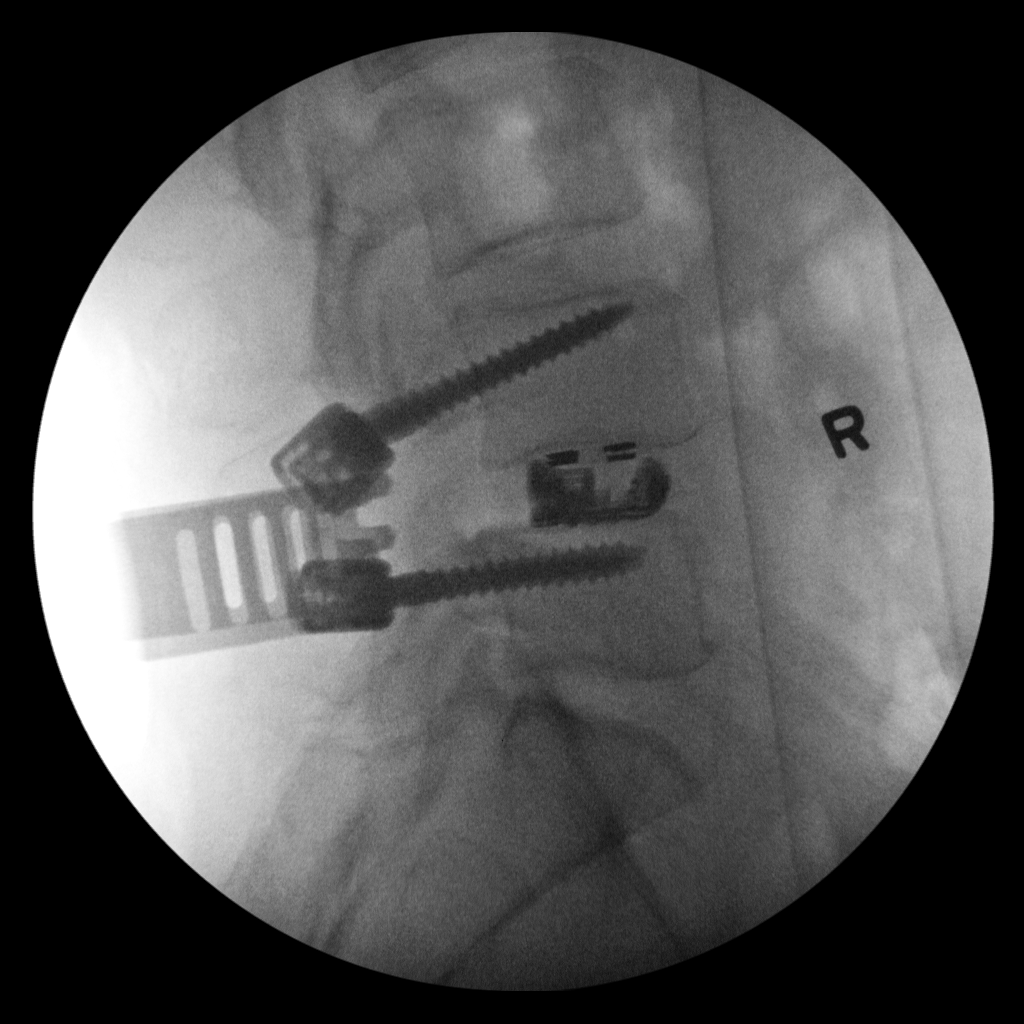
[im 2/2]
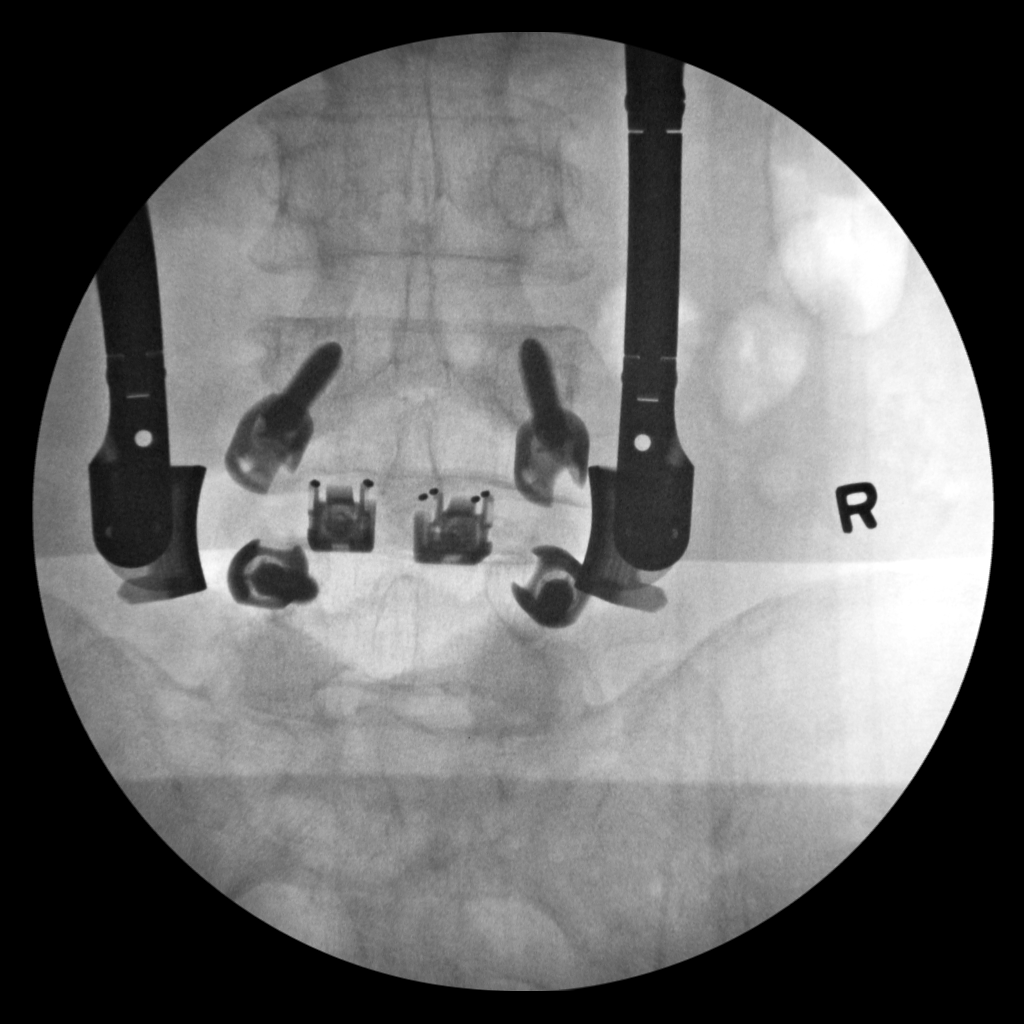

[2 of 2 positions shown; findings below may reference images not displayed]

FINDINGS: Fluoroscopic guidance was provided for the procedure. 24 seconds of
fluoroscopy. Two spot fluoroscopic images are submitted.

Five lumbar type vertebral bodies are assumed. The submitted images
demonstrate the placement of bilateral pedicle screws at the L4 and
L5 levels. The interconnecting rods have not yet been placed.
Interbody spacers are well positioned.
IMPRESSION: Intraoperative views during L4-5 PLIF. No demonstrated
complications.

## 2025-03-06 ENCOUNTER — Ambulatory Visit (HOSPITAL_BASED_OUTPATIENT_CLINIC_OR_DEPARTMENT_OTHER): Admitting: Family Medicine
